# Patient Record
Sex: Female | Born: 1980 | Race: Black or African American | Hispanic: No | Marital: Married | Smoking: Never smoker
Health system: Southern US, Community
[De-identification: ages and names within clinical notes are randomized; demographics above are authoritative.]

## PROBLEM LIST (undated history)

## (undated) ENCOUNTER — Inpatient Hospital Stay (HOSPITAL_COMMUNITY): Payer: Self-pay

## (undated) DIAGNOSIS — Z789 Other specified health status: Secondary | ICD-10-CM

## (undated) HISTORY — DX: Other specified health status: Z78.9

---

## 2014-06-26 DIAGNOSIS — O321XX0 Maternal care for breech presentation, not applicable or unspecified: Secondary | ICD-10-CM

## 2016-07-06 ENCOUNTER — Encounter: Payer: Self-pay | Admitting: Obstetrics and Gynecology

## 2016-07-06 ENCOUNTER — Ambulatory Visit (INDEPENDENT_AMBULATORY_CARE_PROVIDER_SITE_OTHER): Payer: BLUE CROSS/BLUE SHIELD | Admitting: Obstetrics and Gynecology

## 2016-07-06 VITALS — BP 131/79 | HR 134 | Temp 99.5°F | Ht 62.0 in | Wt 142.9 lb

## 2016-07-06 DIAGNOSIS — O34219 Maternal care for unspecified type scar from previous cesarean delivery: Secondary | ICD-10-CM | POA: Insufficient documentation

## 2016-07-06 DIAGNOSIS — Z3490 Encounter for supervision of normal pregnancy, unspecified, unspecified trimester: Secondary | ICD-10-CM | POA: Insufficient documentation

## 2016-07-06 DIAGNOSIS — O3412 Maternal care for benign tumor of corpus uteri, second trimester: Secondary | ICD-10-CM

## 2016-07-06 DIAGNOSIS — D259 Leiomyoma of uterus, unspecified: Secondary | ICD-10-CM

## 2016-07-06 DIAGNOSIS — Z98891 History of uterine scar from previous surgery: Secondary | ICD-10-CM

## 2016-07-06 DIAGNOSIS — Z3492 Encounter for supervision of normal pregnancy, unspecified, second trimester: Secondary | ICD-10-CM | POA: Diagnosis not present

## 2016-07-06 NOTE — Addendum Note (Signed)
Addended by: Manuela Schwartz C on: 07/06/2016 03:26 PM   Modules accepted: Orders

## 2016-07-06 NOTE — Progress Notes (Signed)
Subjective:  Isabella Hampton is a 35 y.o. G2P0001 at [redacted]w[redacted]d being seen today for inital prenatal care.  She is currently monitored for the following issues for this high-risk pregnancy and has Supervision of low-risk pregnancy and H/O cesarean section on her problem list. Pt reports c section in [place] due to fibroids and breech presentation. She reports not being told she could not have a vaginal delivery  Patient reports fatigue.   .  .   . Denies leaking of fluid.   The following portions of the patient's history were reviewed and updated as appropriate: allergies, current medications, past family history, past medical history, past social history, past surgical history and problem list. Problem list updated.  Objective:   Vitals:   07/06/16 1110  BP: 131/79  Pulse: (!) 134  Temp: 99.5 F (37.5 C)  Weight: 142 lb 14.4 oz (64.8 kg)    Fetal Status:           General:  Alert, oriented and cooperative. Patient is in no acute distress.  Skin: Skin is warm and dry. No rash noted.   Cardiovascular: Normal heart rate noted  Respiratory: Normal respiratory effort, no problems with respiration noted  Abdomen: Soft, gravid, 22 weeks, consent with fibroids.     Pelvic:  Cervical exam performed        Extremities: Normal range of motion.     Mental Status: Normal mood and affect. Normal behavior. Normal judgment and thought content.   Urinalysis: Urine Protein: Negative Urine Glucose: Negative  Assessment and Plan:  Pregnancy: G2P0001 at [redacted]w[redacted]d  1. Supervision of low-risk pregnancy, second trimester  - HIV antibody - Hemoglobinopathy evaluation - Varicella zoster antibody, IgG - Culture, OB Urine - Prenatal Profile I - Pap IG, CT/NG w/ reflex HPV when ASC-U - ToxASSURE Select 13 (MW), Urine - [place] MFM OB COMP + 14 WK; Future  2. H/O cesarean section Discussed briefly with pt VBAC vs RLTCS. No records since performed in [place].   3. Prenatal care, second trimester Continue with  OTC PNV  4. Uterine fibroids affecting pregnancy, second trimester U/S ordered  Preterm labor symptoms and general obstetric precautions including but not limited to vaginal bleeding, contractions, leaking of fluid and fetal movement were reviewed in detail with the patient. Please refer to After Visit Summary for other counseling recommendations.  Return in about 4 weeks (around 08/03/2016).   Chancy Milroy, MD

## 2016-07-08 LAB — GC/CHLAMYDIA PROBE AMP
Chlamydia trachomatis, NAA: NEGATIVE
NEISSERIA GONORRHOEAE BY PCR: NEGATIVE

## 2016-07-08 LAB — URINE CULTURE, OB REFLEX

## 2016-07-08 LAB — CULTURE, OB URINE

## 2016-07-10 LAB — PAP IG, CT-NG, RFX HPV ASCU
Chlamydia, Nuc. Acid Amp: NEGATIVE
Gonococcus by Nucleic Acid Amp: NEGATIVE
PAP SMEAR COMMENT: 0

## 2016-07-12 ENCOUNTER — Telehealth: Payer: Self-pay | Admitting: *Deleted

## 2016-07-12 NOTE — Telephone Encounter (Signed)
-----   Message from Chancy Milroy, MD sent at 07/10/2016  8:13 AM EDT ----- Please notify pt of test results Thanks Legrand Como ----- Message ----- From: Lavone Neri Lab Results In Sent: 07/07/2016   6:36 AM To: Chancy Milroy, MD

## 2016-07-12 NOTE — Telephone Encounter (Signed)
Attempted to contact patient. No answer. Voice mail left stating I am calling with non urgent test results, please return my call at the clinic.

## 2016-07-12 NOTE — Telephone Encounter (Signed)
Called pt and informed her of normal test results.  Pt voiced understanding.

## 2016-07-13 ENCOUNTER — Telehealth: Payer: Self-pay | Admitting: *Deleted

## 2016-07-13 NOTE — Telephone Encounter (Signed)
Patient called to c/o cold symptoms.Call was returned with no answer.Message left for patient to call office back.

## 2016-07-14 LAB — PRENATAL PROFILE I(LABCORP)
Antibody Screen: NEGATIVE
BASOS ABS: 0 10*3/uL (ref 0.0–0.2)
Basos: 0 %
EOS (ABSOLUTE): 0 10*3/uL (ref 0.0–0.4)
Eos: 0 %
HEMOGLOBIN: 12.5 g/dL (ref 11.1–15.9)
HEP B S AG: NEGATIVE
Hematocrit: 36.6 % (ref 34.0–46.6)
IMMATURE GRANULOCYTES: 0 %
Immature Grans (Abs): 0 10*3/uL (ref 0.0–0.1)
Lymphocytes Absolute: 1.3 10*3/uL (ref 0.7–3.1)
Lymphs: 19 %
MCH: 27.7 pg (ref 26.6–33.0)
MCHC: 34.2 g/dL (ref 31.5–35.7)
MCV: 81 fL (ref 79–97)
MONOS ABS: 0.4 10*3/uL (ref 0.1–0.9)
Monocytes: 6 %
NEUTROS ABS: 5.2 10*3/uL (ref 1.4–7.0)
NEUTROS PCT: 75 %
PLATELETS: 194 10*3/uL (ref 150–379)
RBC: 4.51 x10E6/uL (ref 3.77–5.28)
RDW: 14.6 % (ref 12.3–15.4)
RPR Ser Ql: NONREACTIVE
Rh Factor: POSITIVE
Rubella Antibodies, IGG: 7.59 index (ref >0.99)
WBC: 7 10*3/uL (ref 3.4–10.8)

## 2016-07-14 LAB — HEMOGLOBINOPATHY EVALUATION
HEMOGLOBIN F QUANTITATION: 0 % (ref 0.0–2.0)
HGB C: 0 %
HGB S: 0 %
Hemoglobin A2 Quantitation: 2.3 % (ref 0.7–3.1)
Hgb A: 97.7 % (ref 94.0–98.0)

## 2016-07-14 LAB — HIV ANTIBODY (ROUTINE TESTING W REFLEX): HIV SCREEN 4TH GENERATION: NONREACTIVE

## 2016-07-14 LAB — VARICELLA ZOSTER ANTIBODY, IGG: Varicella zoster IgG: <135 index — ABNORMAL LOW (ref >165)

## 2016-07-14 LAB — TOXASSURE SELECT 13 (MW), URINE

## 2016-07-18 ENCOUNTER — Encounter (HOSPITAL_COMMUNITY): Payer: Self-pay | Admitting: Obstetrics and Gynecology

## 2016-07-27 ENCOUNTER — Ambulatory Visit (HOSPITAL_COMMUNITY)
Admission: RE | Admit: 2016-07-27 | Discharge: 2016-07-27 | Disposition: A | Discharge status: Home / Self Care | Payer: BLUE CROSS/BLUE SHIELD | Source: Ambulatory Visit | Attending: Obstetrics and Gynecology | Admitting: Obstetrics and Gynecology

## 2016-07-27 DIAGNOSIS — Z3A19 19 weeks gestation of pregnancy: Secondary | ICD-10-CM | POA: Insufficient documentation

## 2016-07-27 DIAGNOSIS — Z369 Encounter for antenatal screening, unspecified: Secondary | ICD-10-CM | POA: Diagnosis not present

## 2016-07-27 DIAGNOSIS — O34219 Maternal care for unspecified type scar from previous cesarean delivery: Secondary | ICD-10-CM | POA: Insufficient documentation

## 2016-07-27 DIAGNOSIS — Z3492 Encounter for supervision of normal pregnancy, unspecified, second trimester: Secondary | ICD-10-CM

## 2016-07-28 ENCOUNTER — Encounter: Payer: Self-pay | Admitting: Obstetrics and Gynecology

## 2016-07-28 DIAGNOSIS — D259 Leiomyoma of uterus, unspecified: Secondary | ICD-10-CM | POA: Insufficient documentation

## 2016-07-28 DIAGNOSIS — O3410 Maternal care for benign tumor of corpus uteri, unspecified trimester: Secondary | ICD-10-CM

## 2016-08-01 ENCOUNTER — Other Ambulatory Visit: Payer: Self-pay

## 2016-08-01 DIAGNOSIS — Z3492 Encounter for supervision of normal pregnancy, unspecified, second trimester: Secondary | ICD-10-CM

## 2016-08-03 ENCOUNTER — Ambulatory Visit (INDEPENDENT_AMBULATORY_CARE_PROVIDER_SITE_OTHER): Payer: BLUE CROSS/BLUE SHIELD | Admitting: Obstetrics and Gynecology

## 2016-08-03 DIAGNOSIS — O34219 Maternal care for unspecified type scar from previous cesarean delivery: Secondary | ICD-10-CM

## 2016-08-03 DIAGNOSIS — Z98891 History of uterine scar from previous surgery: Secondary | ICD-10-CM

## 2016-08-03 DIAGNOSIS — Z3492 Encounter for supervision of normal pregnancy, unspecified, second trimester: Secondary | ICD-10-CM

## 2016-08-03 NOTE — Progress Notes (Signed)
   PRENATAL VISIT NOTE  Subjective:  Isabella Hampton is a 35 y.o. G2P0001 at [redacted]w[redacted]d being seen today for ongoing prenatal care.  She is currently monitored for the following issues for this low-risk pregnancy and has Supervision of low-risk pregnancy; H/O cesarean section; and Uterine fibroids affecting pregnancy on her problem list.  Patient reports no complaints.  Contractions: Not present. Vag. Bleeding: None.  Movement: Present. Denies leaking of fluid.   The following portions of the patient's history were reviewed and updated as appropriate: allergies, current medications, past family history, past medical history, past social history, past surgical history and problem list. Problem list updated.  Objective:   Vitals:   08/03/16 1045  BP: (!) 138/93  Pulse: (!) 114  Temp: 98.3 F (36.8 C)  Weight: 145 lb 6.4 oz (66 kg)    Fetal Status:     Movement: Present     General:  Alert, oriented and cooperative. Patient is in no acute distress.  Skin: Skin is warm and dry. No rash noted.   Cardiovascular: Normal heart rate noted  Respiratory: Normal respiratory effort, no problems with respiration noted  Abdomen: Soft, gravid, appropriate for gestational age. Pain/Pressure: Absent     Pelvic:  Cervical exam deferred        Extremities: Normal range of motion.  Edema: None  Mental Status: Normal mood and affect. Normal behavior. Normal judgment and thought content.   Urinalysis: Urine Protein: Negative Urine Glucose: Negative  Assessment and Plan:  Pregnancy: G2P0001 at [redacted]w[redacted]d  1. Encounter for supervision of low-risk pregnancy in second trimester Patient is doing well without complaints Reviewed anatomy ultrasound reports. Patient is scheduled for follow up growth in 4 weeks Patient declined flu vaccine Patient declined quad screen Patient undecided on TOLAC vs RCS. Information provided Monitor BP closely  General obstetric precautions including but not limited to vaginal  bleeding, contractions, leaking of fluid and fetal movement were reviewed in detail with the patient. Please refer to After Visit Summary for other counseling recommendations.  Return in about 4 weeks (around 08/31/2016).  Mora Bellman, MD

## 2016-08-03 NOTE — Patient Instructions (Signed)
Trial of Labor After Cesarean Delivery Information A trial of labor after cesarean delivery (TOLAC) is when a woman tries to give birth vaginally after a previous cesarean delivery. TOLAC may be a safe and appropriate option for you depending on your medical history and other risk factors. When TOLAC is successful and you are able to have a vaginal delivery, this is called a vaginal birth after cesarean delivery (VBAC).  CANDIDATES FOR TOLAC TOLAC is possible for some women who:  Have undergone one or two prior cesarean deliveries in which the incision of the uterus was horizontal (low transverse).  Are carrying twins and have had one prior low transverse incision during a cesarean delivery.  Do not have a vertical (classical) uterine scar.  Have not had a tear in the wall of their uterus (uterine rupture). TOLAC is also supported for women who meet appropriate criteria and:  Are under the age of 40 years.  Are tall and have a body mass index (BMI) of less than 30.  Have an unknown uterine scar.  Give birth in a facility equipped to handle an emergency cesarean delivery. This team should be able to handle possible complications such as a uterine rupture.  Have thorough counseling about the benefits and risks of TOLAC.  Have discussed future pregnancy plans with their health care provider.  Plan to have several more pregnancies. MOST SUCCESSFUL CANDIDATES FOR TOLAC:  Have had a successful vaginal delivery before or after their cesarean delivery.  Experience labor that begins naturally on or before the due date (40 weeks of gestation).  Do not have a very large (macrosomic) baby.   Had a prior cesarean delivery but are not currently experiencing factors that would prompt a cesarean delivery (such as a breech position).  Had only one prior cesarean delivery.  Had a prior cesarean delivery that was performed early in labor and not after full cervical dilation. TOLAC may be most  appropriate for women who meet the above guidelines and who plan to have more pregnancies. TOLAC is not recommended for home births. LEAST SUCCESSFUL CANDIDATES FOR TOLAC:  Have an induced labor with an unfavorable cervix. An unfavorable cervix is when the cervix is not dilating enough (among other factors).  Have never had a vaginal delivery.  Have had more than two cesarean deliveries.  Have a pregnancy at more than 40 weeks of gestation.  Are pregnant with a baby with a suspected weight greater than 4,000 grams (8 pounds) and who have no prior history of a vaginal delivery.  Have closely spaced pregnancies. SUGGESTED BENEFITS OF TOLAC  You may have a faster recovery time.  You may have a shorter stay in the hospital.  You may have less pain and fewer problems than with a cesarean delivery. Women who have a cesarean delivery have a higher chance of needing blood or getting a fever, an infection, or a blood clot in the legs. SUGGESTED RISKS OF TOLAC The highest risk of complications happens to women who attempt a TOLAC and fail. A failed TOLAC results in an unplanned cesarean delivery. Risks related to TOLAC or repeat cesarean deliveries include:   Blood loss.  Infection.  Blood clot.  Injury to surrounding tissues or organs.  Having to remove the uterus (hysterectomy).  Potential problems with the placenta (such as placenta previa or placenta accreta) in future pregnancies. Although very rare, the main concerns with TOLAC are:  Rupture of the uterine scar from a past cesarean delivery.  Needing an   emergency cesarean delivery.  Having a bad outcome for the baby (perinatal morbidity). FOR MORE INFORMATION American Congress of Obstetricians and Gynecologists: www.acog.[place]: www.midwife.org   This information is not intended to replace advice given to you by your health care provider. Make sure you discuss any questions you have with  your health care provider.   Document Released: 06/27/2011 Document Revised: 07/30/2013 Document Reviewed: 03/31/2013 Elsevier Interactive Patient Education [employer].

## 2016-08-03 NOTE — Progress Notes (Signed)
Repeat BP 147/82

## 2016-08-03 NOTE — Progress Notes (Signed)
Patient is in office and states that she feels well but is a little anxious, reports good fetal movement.

## 2016-08-14 ENCOUNTER — Telehealth: Payer: Self-pay | Admitting: Obstetrics and Gynecology

## 2016-08-14 DIAGNOSIS — Z3492 Encounter for supervision of normal pregnancy, unspecified, second trimester: Secondary | ICD-10-CM

## 2016-08-14 MED ORDER — PRENATAL VITAMINS 0.8 MG PO TABS: 1.0000 | ORAL_TABLET | Freq: Every day | ORAL | 12 refills | Status: DC

## 2016-08-14 NOTE — Telephone Encounter (Signed)
Patient called to state that her prenatal vitamins were never called in.  Routed Rx to pharmacy per protocol.

## 2016-08-31 ENCOUNTER — Ambulatory Visit (INDEPENDENT_AMBULATORY_CARE_PROVIDER_SITE_OTHER): Payer: BLUE CROSS/BLUE SHIELD | Admitting: Obstetrics & Gynecology

## 2016-08-31 VITALS — BP 131/87 | HR 139 | Temp 97.2°F | Wt 146.4 lb

## 2016-08-31 DIAGNOSIS — O34219 Maternal care for unspecified type scar from previous cesarean delivery: Secondary | ICD-10-CM

## 2016-08-31 DIAGNOSIS — Z3492 Encounter for supervision of normal pregnancy, unspecified, second trimester: Secondary | ICD-10-CM

## 2016-08-31 MED ORDER — PRENATAL VITAMINS 0.8 MG PO TABS: 1.0000 | ORAL_TABLET | Freq: Every day | ORAL | 12 refills | Status: AC

## 2016-08-31 NOTE — Progress Notes (Signed)
   PRENATAL VISIT NOTE  Subjective:  Isabella Hampton is a 35 y.o. G2P0001 at [redacted]w[redacted]d being seen today for ongoing prenatal care.  She is currently monitored for the following issues for this low-risk pregnancy and has Supervision of low-risk pregnancy; Previous cesarean delivery, antepartum; and Uterine fibroids affecting pregnancy on her problem list.  Patient reports no complaints.  Contractions: Not present. Vag. Bleeding: None.  Movement: Present. Denies leaking of fluid.   The following portions of the patient's history were reviewed and updated as appropriate: allergies, current medications, past family history, past medical history, past social history, past surgical history and problem list. Problem list updated.  Objective:   Vitals:   08/31/16 1306  BP: 131/87  Pulse: (!) 139  Temp: 97.2 F (36.2 C)  Weight: 146 lb 6.4 oz (66.4 kg)    Fetal Status: Fetal Heart Rate (bpm): 157 Fundal Height: 24 cm Movement: Present     General:  Alert, oriented and cooperative. Patient is in no acute distress.  Skin: Skin is warm and dry. No rash noted.   Cardiovascular: Normal heart rate noted  Respiratory: Normal respiratory effort, no problems with respiration noted  Abdomen: Soft, gravid, appropriate for gestational age. Pain/Pressure: Absent     Pelvic:  Cervical exam deferred        Extremities: Normal range of motion.  Edema: None  Mental Status: Normal mood and affect. Normal behavior. Normal judgment and thought content.   Assessment and Plan:  Pregnancy: G2P0001 at [redacted]w[redacted]d 1. Previous cesarean delivery, antepartum Counseled regarding TOLAC vs RCS; risks/benefits discussed in detail. All questions answered.  Patient elects for TOLAC, consent signed 08/31/2016.   2. Encounter for supervision of low-risk pregnancy in second trimester - Prenatal Multivit-Min-Fe-FA (PRENATAL VITAMINS) 0.8 MG tablet; Take 1 tablet by mouth daily.  Dispense: 30 tablet; Refill: 12 Preterm labor symptoms  and general obstetric precautions including but not limited to vaginal bleeding, contractions, leaking of fluid and fetal movement were reviewed in detail with the patient. Please refer to After Visit Summary for other counseling recommendations.  Return in about 4 weeks (around 09/28/2016) for 2 hr GTT, OB Visit, 3rd trimester labs, TDap.   Osborne Oman, MD

## 2016-08-31 NOTE — Patient Instructions (Signed)
Return to clinic for any scheduled appointments or obstetric concerns, or go to [facility]U for evaluation  

## 2016-09-07 ENCOUNTER — Other Ambulatory Visit: Payer: Self-pay | Admitting: Obstetrics and Gynecology

## 2016-09-07 ENCOUNTER — Ambulatory Visit (HOSPITAL_COMMUNITY)
Admission: RE | Admit: 2016-09-07 | Discharge: 2016-09-07 | Disposition: A | Discharge status: Home / Self Care | Payer: BLUE CROSS/BLUE SHIELD | Source: Ambulatory Visit | Attending: Obstetrics and Gynecology | Admitting: Obstetrics and Gynecology

## 2016-09-07 ENCOUNTER — Ambulatory Visit (HOSPITAL_COMMUNITY): Payer: BLUE CROSS/BLUE SHIELD

## 2016-09-07 DIAGNOSIS — O3413 Maternal care for benign tumor of corpus uteri, third trimester: Secondary | ICD-10-CM

## 2016-09-07 DIAGNOSIS — Z0489 Encounter for examination and observation for other specified reasons: Secondary | ICD-10-CM

## 2016-09-07 DIAGNOSIS — Z3A25 25 weeks gestation of pregnancy: Secondary | ICD-10-CM | POA: Diagnosis not present

## 2016-09-07 DIAGNOSIS — O34219 Maternal care for unspecified type scar from previous cesarean delivery: Secondary | ICD-10-CM | POA: Insufficient documentation

## 2016-09-07 DIAGNOSIS — Z362 Encounter for other antenatal screening follow-up: Secondary | ICD-10-CM | POA: Insufficient documentation

## 2016-09-07 DIAGNOSIS — D259 Leiomyoma of uterus, unspecified: Secondary | ICD-10-CM | POA: Diagnosis not present

## 2016-09-07 DIAGNOSIS — IMO0002 Reserved for concepts with insufficient information to code with codable children: Secondary | ICD-10-CM

## 2016-09-07 DIAGNOSIS — O3412 Maternal care for benign tumor of corpus uteri, second trimester: Secondary | ICD-10-CM | POA: Insufficient documentation

## 2016-09-07 DIAGNOSIS — Z3492 Encounter for supervision of normal pregnancy, unspecified, second trimester: Secondary | ICD-10-CM

## 2016-09-11 ENCOUNTER — Encounter: Payer: Self-pay | Admitting: *Deleted

## 2016-09-28 ENCOUNTER — Encounter: Payer: Self-pay | Admitting: Obstetrics

## 2016-09-28 ENCOUNTER — Ambulatory Visit (INDEPENDENT_AMBULATORY_CARE_PROVIDER_SITE_OTHER): Payer: BLUE CROSS/BLUE SHIELD | Admitting: Obstetrics

## 2016-09-28 ENCOUNTER — Other Ambulatory Visit: Payer: BLUE CROSS/BLUE SHIELD

## 2016-09-28 VITALS — BP 146/84 | HR 130 | Wt 154.0 lb

## 2016-09-28 DIAGNOSIS — Z3483 Encounter for supervision of other normal pregnancy, third trimester: Secondary | ICD-10-CM

## 2016-09-28 DIAGNOSIS — Z3493 Encounter for supervision of normal pregnancy, unspecified, third trimester: Secondary | ICD-10-CM

## 2016-09-28 NOTE — Progress Notes (Signed)
Subjective:    Isabella Hampton is a 35 y.o. female being seen today for her obstetrical visit. She is at [redacted]w[redacted]d gestation. Patient reports no complaints. Fetal movement: normal.  Problem List Items Addressed This Visit    None     Patient Active Problem List   Diagnosis Date Noted  . Uterine fibroids affecting pregnancy 07/28/2016  . Supervision of low-risk pregnancy 07/06/2016  . Previous cesarean delivery, antepartum 07/06/2016   Objective:    BP (!) 146/84   Pulse (!) 130   Wt 154 lb (69.9 kg)   LMP 03/16/2016 (Approximate)   BMI 28.17 kg/m  FHT:  150 BPM  Uterine Size: size equals dates  Presentation: unsure     Assessment:    Pregnancy @ [redacted]w[redacted]d weeks   Plan:     labs reviewed, problem list updated Consent signed. GBS sent TDAP offered  Rhogam given for RH negative Pediatrician: discussed. Infant feeding: plans to breastfeed. Maternity leave: discussed. Cigarette smoking: never smoked. No orders of the defined types were placed in this encounter.  No orders of the defined types were placed in this encounter.  Follow up in 2 Weeks.   Patient ID: Isabella Hampton, female   DOB: 28-Apr-1981, 35 y.o.   MRN: [id number]

## 2016-09-29 LAB — CBC
Hematocrit: 34.6 % (ref 34.0–46.6)
Hemoglobin: 11.8 g/dL (ref 11.1–15.9)
MCH: 28.9 pg (ref 26.6–33.0)
MCHC: 34.1 g/dL (ref 31.5–35.7)
MCV: 85 fL (ref 79–97)
Platelets: 186 10*3/uL (ref 150–379)
RBC: 4.08 x10E6/uL (ref 3.77–5.28)
RDW: 13.7 % (ref 12.3–15.4)
WBC: 7.5 10*3/uL (ref 3.4–10.8)

## 2016-09-29 LAB — HIV ANTIBODY (ROUTINE TESTING W REFLEX): HIV SCREEN 4TH GENERATION: NONREACTIVE

## 2016-09-29 LAB — GLUCOSE TOLERANCE, 2 HOURS W/ 1HR
GLUCOSE, 1 HOUR: 118 mg/dL (ref 65–179)
GLUCOSE, 2 HOUR: 103 mg/dL (ref 65–152)
GLUCOSE, FASTING: 69 mg/dL (ref 65–91)

## 2016-09-29 LAB — RPR: RPR: NONREACTIVE

## 2016-10-17 ENCOUNTER — Encounter: Payer: BLUE CROSS/BLUE SHIELD | Admitting: Obstetrics

## 2016-10-23 NOTE — L&D Delivery Note (Signed)
Delivery Note At 2:56 PM a viable and healthy female was delivered via VBAC, Spontaneous (Presentation: OA).  APGAR: 8, 9; weight  Pending.   Placenta status: spontaneous, intact. Cord:  with the following complications: None.  Cord pH: Na  Anesthesia: Epidural, Local Episiotomy: None Lacerations: 2nd degree;Perineal Suture Repair: 3.0 vicryl rapide Est. Blood Loss (mL): 200  Mom to postpartum.  Baby to Couplet care / Skin to Skin. Placenta to: BS Feeding: Breast Circ: NA Contraception: LAM  Manya Silvas 12/23/2016, 4:02 PM

## 2016-11-13 ENCOUNTER — Encounter: Payer: Self-pay | Admitting: *Deleted

## 2016-11-13 ENCOUNTER — Ambulatory Visit (INDEPENDENT_AMBULATORY_CARE_PROVIDER_SITE_OTHER): Payer: BLUE CROSS/BLUE SHIELD | Admitting: Obstetrics

## 2016-11-13 VITALS — BP 147/82 | HR 122 | Wt 159.0 lb

## 2016-11-13 DIAGNOSIS — Z3480 Encounter for supervision of other normal pregnancy, unspecified trimester: Secondary | ICD-10-CM

## 2016-11-13 DIAGNOSIS — Z3490 Encounter for supervision of normal pregnancy, unspecified, unspecified trimester: Secondary | ICD-10-CM

## 2016-11-13 DIAGNOSIS — Z3483 Encounter for supervision of other normal pregnancy, third trimester: Secondary | ICD-10-CM

## 2016-11-13 NOTE — Progress Notes (Signed)
Subjective:    Isabella Hampton is a 36 y.o. female being seen today for her obstetrical visit. She is at [redacted]w[redacted]d gestation. Patient reports no complaints. Fetal movement: normal.  Problem List Items Addressed This Visit    Supervision of low-risk pregnancy     Patient Active Problem List   Diagnosis Date Noted  . Uterine fibroids affecting pregnancy 07/28/2016  . Supervision of low-risk pregnancy 07/06/2016  . Previous cesarean delivery, antepartum 07/06/2016   Objective:    BP (!) 147/82   Pulse (!) 122   Wt 159 lb (72.1 kg)   LMP 03/16/2016 (Approximate)   BMI 29.08 kg/m  FHT:  150 BPM  Uterine Size: size equals dates  Presentation: unsure     Assessment:    Pregnancy @ [redacted]w[redacted]d weeks   Plan:     labs reviewed, problem list updated Consent signed. GBS sent TDAP offered  Rhogam given for RH negative Pediatrician: discussed. Infant feeding: plans to breastfeed. Maternity leave: discussed. Cigarette smoking: never smoked. No orders of the defined types were placed in this encounter.  No orders of the defined types were placed in this encounter.  Follow up in 2 Weeks.   Patient ID: Isabella Hampton, female   DOB: 03-25-1981, 36 y.o.   MRN: [id number]

## 2016-11-13 NOTE — Progress Notes (Signed)
Patient reports no problems today.  

## 2016-11-28 ENCOUNTER — Ambulatory Visit (INDEPENDENT_AMBULATORY_CARE_PROVIDER_SITE_OTHER): Payer: BLUE CROSS/BLUE SHIELD | Admitting: Certified Nurse Midwife

## 2016-11-28 VITALS — BP 133/79 | HR 116 | Wt 162.0 lb

## 2016-11-28 DIAGNOSIS — Z113 Encounter for screening for infections with a predominantly sexual mode of transmission: Secondary | ICD-10-CM

## 2016-11-28 DIAGNOSIS — Z3493 Encounter for supervision of normal pregnancy, unspecified, third trimester: Secondary | ICD-10-CM | POA: Diagnosis not present

## 2016-11-28 DIAGNOSIS — O34219 Maternal care for unspecified type scar from previous cesarean delivery: Secondary | ICD-10-CM

## 2016-11-28 NOTE — Patient Instructions (Signed)
AREA PEDIATRIC/FAMILY PRACTICE PHYSICIANS  [facility] [address], [address] [place], [place]  [address] Phone - [phone]   Fax - [phone]  [facility] [address] [place], [place] [address] Phone - [phone]   Fax - [phone]  [facility] [address] [place], [place]  [address] Phone - [phone]   Fax - [phone]  [facility] [address] [place], [place]  [address] Phone - [phone]   Fax - [phone]  [facility] [address] [place], [place]  [address] Phone - [phone]   Fax - [phone]  [facility] [address] [place], [place]  [address] Phone - [phone]   Fax - [phone]  [facility] [address] [place], [place]  [address] Phone - [phone]   Fax - [phone]  [facility] [address] [place], [place]  [address] Phone - [phone]   Fax - [phone]  [facility] [address] [place], [place]  [address] Phone - [phone]   Fax - [phone] [facility] [address] [place], [place]  [address] Phone - [phone]   Fax - [phone]  [facility] [address] [place], [place]  [address] Phone - [phone]   Fax - [phone]  [facility] [address] [place], [place]  [address] Phone - [phone]   Fax - [phone]  [facility] [address] [place], [place]  [address] Phone - [phone]   Fax - [phone]  [facility] [address] [place], [place]  [address] Phone - [phone]  [facility] [address] [place], [place]  [address] Phone - [phone]   Fax - [phone]  [facility] [address] [place], [place]  [address] Phone - [phone]   Fax - [phone]  [facility] [address] [place], [place]  [address] Phone - [phone]   Fax - [phone]  [facility] [address] [place], [place]  [address] Phone - [phone]   Fax - [phone]   [facility] [address] [place], [place]  [address] Phone - [phone]   Fax - [phone]  [facility] [address] [place], [place]  [address] Phone - [phone]   Fax - [phone]  [facility] [address] [place], [place]  [address] Phone - [phone]   Fax - [phone]  [facility] [address] , [place]  [address] Phone - [phone]   Fax - [phone]  [facility] [address] , [place]  [address] Phone - [phone]   Fax - [phone] [facility] [address] [place], [place]  [address] Phone - [phone]   Fax - [phone]  [facility] [address] [place], [place]  [address] Phone - [phone]   Fax - [phone]  [facility] [address] [place],   [address] Phone - [phone]   Fax - [phone]   

## 2016-11-28 NOTE — Progress Notes (Signed)
   PRENATAL VISIT NOTE  Subjective:  Isabella Hampton is a 36 y.o. G2P0001 at [redacted]w[redacted]d being seen today for ongoing prenatal care.  She is currently monitored for the following issues for this low-risk pregnancy and has Supervision of low-risk pregnancy; Previous cesarean delivery, antepartum; and Uterine fibroids affecting pregnancy on her problem list.  Patient reports no complaints.  Contractions: Irregular. Vag. Bleeding: None.  Movement: Present. Denies leaking of fluid.   The following portions of the patient's history were reviewed and updated as appropriate: allergies, current medications, past family history, past medical history, past social history, past surgical history and problem list. Problem list updated.  Objective:   Vitals:   11/28/16 1422  BP: 133/79  Pulse: (!) 116  Weight: 162 lb (73.5 kg)    Fetal Status: Fetal Heart Rate (bpm): 146 Fundal Height: 37 cm Movement: Present  Presentation: Vertex  General:  Alert, oriented and cooperative. Patient is in no acute distress.  Skin: Skin is warm and dry. No rash noted.   Cardiovascular: Normal heart rate noted  Respiratory: Normal respiratory effort, no problems with respiration noted  Abdomen: Soft, gravid, appropriate for gestational age. Pain/Pressure: Present     Pelvic:  Cervical exam performed Dilation: Closed Effacement (%): 0 Station: -3  Extremities: Normal range of motion.     Mental Status: Normal mood and affect. Normal behavior. Normal judgment and thought content.   Assessment and Plan:  Pregnancy: G2P0001 at [redacted]w[redacted]d  1. Encounter for supervision of low-risk pregnancy in third trimester  - Strep Gp B NAA - Cervicovaginal ancillary only  2. Previous cesarean delivery, antepartum     TOLAC consent on file: 08-31-16.    Term labor symptoms and general obstetric precautions including but not limited to vaginal bleeding, contractions, leaking of fluid and fetal movement were reviewed in detail with the  patient. Please refer to After Visit Summary for other counseling recommendations.  Return in about 1 week (around 12/05/2016) for ROB.   Morene Crocker, CNM

## 2016-11-29 LAB — CERVICOVAGINAL ANCILLARY ONLY
Bacterial vaginitis: NEGATIVE
CHLAMYDIA, DNA PROBE: NEGATIVE
Candida vaginitis: NEGATIVE
Neisseria Gonorrhea: NEGATIVE
TRICH (WINDOWPATH): NEGATIVE

## 2016-11-30 LAB — STREP GP B NAA: Strep Gp B NAA: NEGATIVE

## 2016-12-05 ENCOUNTER — Inpatient Hospital Stay (HOSPITAL_COMMUNITY)
Admission: AD | Admit: 2016-12-05 | Discharge: 2016-12-06 | Disposition: A | Discharge status: Home / Self Care | Payer: BLUE CROSS/BLUE SHIELD | Source: Ambulatory Visit | Attending: Obstetrics & Gynecology | Admitting: Obstetrics & Gynecology

## 2016-12-05 ENCOUNTER — Encounter (HOSPITAL_COMMUNITY): Payer: Self-pay

## 2016-12-05 DIAGNOSIS — O26899 Other specified pregnancy related conditions, unspecified trimester: Secondary | ICD-10-CM | POA: Insufficient documentation

## 2016-12-05 DIAGNOSIS — R109 Unspecified abdominal pain: Secondary | ICD-10-CM | POA: Insufficient documentation

## 2016-12-05 DIAGNOSIS — Z3A00 Weeks of gestation of pregnancy not specified: Secondary | ICD-10-CM | POA: Insufficient documentation

## 2016-12-05 NOTE — MAU Note (Signed)
Patient presents with c/o abdominal pain. Patient states that it was a sharp constant pain that happened this morning for 2 hrs. Patient is not having that pain now but is having some slight cramping. Patient denies and bleeding or LOF. Fetus active.

## 2016-12-06 DIAGNOSIS — O26899 Other specified pregnancy related conditions, unspecified trimester: Secondary | ICD-10-CM | POA: Diagnosis not present

## 2016-12-06 DIAGNOSIS — Z3A00 Weeks of gestation of pregnancy not specified: Secondary | ICD-10-CM | POA: Diagnosis not present

## 2016-12-06 DIAGNOSIS — R109 Unspecified abdominal pain: Secondary | ICD-10-CM | POA: Diagnosis present

## 2016-12-06 NOTE — Progress Notes (Signed)
Notified of pt arrival in MAU with complaint and exam. Ok to discharge home with labor precautions.

## 2016-12-06 NOTE — Discharge Instructions (Signed)
Third Trimester of Pregnancy The third trimester is from week 29 through week 40 (months 7 through 9). The third trimester is a time when the unborn baby (fetus) is growing rapidly. At the end of the ninth month, the fetus is about 20 inches in length and weighs 6-10 pounds. Body changes during your third trimester Your body goes through many changes during pregnancy. The changes vary from woman to woman. During the third trimester:  Your weight will continue to increase. You can expect to gain 25-35 pounds (11-16 kg) by the end of the pregnancy.  You may begin to get stretch marks on your hips, abdomen, and breasts.  You may urinate more often because the fetus is moving lower into your pelvis and pressing on your bladder.  You may develop or continue to have heartburn. This is caused by increased hormones that slow down muscles in the digestive tract.  You may develop or continue to have constipation because increased hormones slow digestion and cause the muscles that push waste through your intestines to relax.  You may develop hemorrhoids. These are swollen veins (varicose veins) in the rectum that can itch or be painful.  You may develop swollen, bulging veins (varicose veins) in your legs.  You may have increased body aches in the pelvis, back, or thighs. This is due to weight gain and increased hormones that are relaxing your joints.  You may have changes in your hair. These can include thickening of your hair, rapid growth, and changes in texture. Some women also have hair loss during or after pregnancy, or hair that feels dry or thin. Your hair will most likely return to normal after your baby is born.  Your breasts will continue to grow and they will continue to become tender. A yellow fluid (colostrum) may leak from your breasts. This is the first milk you are producing for your baby.  Your belly button may stick out.  You may notice more swelling in your hands, face, or  ankles.  You may have increased tingling or numbness in your hands, arms, and legs. The skin on your belly may also feel numb.  You may feel short of breath because of your expanding uterus.  You may have more problems sleeping. This can be caused by the size of your belly, increased need to urinate, and an increase in your body's metabolism.  You may notice the fetus "dropping," or moving lower in your abdomen.  You may have increased vaginal discharge.  Your cervix becomes thin and soft (effaced) near your due date. What to expect at prenatal visits You will have prenatal exams every 2 weeks until week 36. Then you will have weekly prenatal exams. During a routine prenatal visit:  You will be weighed to make sure you and the fetus are growing normally.  Your blood pressure will be taken.  Your abdomen will be measured to track your baby's growth.  The fetal heartbeat will be listened to.  Any test results from the previous visit will be discussed.  You may have a cervical check near your due date to see if you have effaced. At around 36 weeks, your health care provider will check your cervix. At the same time, your health care provider will also perform a test on the secretions of the vaginal tissue. This test is to determine if a type of bacteria, Group B streptococcus, is present. Your health care provider will explain this further. Your health care provider may ask you:  What your birth plan is.  How you are feeling.  If you are feeling the baby move.  If you have had any abnormal symptoms, such as leaking fluid, bleeding, severe headaches, or abdominal cramping.  If you are using any tobacco products, including cigarettes, chewing tobacco, and electronic cigarettes.  If you have any questions. Other tests or screenings that may be performed during your third trimester include:  Blood tests that check for low iron levels (anemia).  Fetal testing to check the health,  activity level, and growth of the fetus. Testing is done if you have certain medical conditions or if there are problems during the pregnancy.  Nonstress test (NST). This test checks the health of your baby to make sure there are no signs of problems, such as the baby not getting enough oxygen. During this test, a belt is placed around your belly. The baby is made to move, and its heart rate is monitored during movement. What is false labor? False labor is a condition in which you feel small, irregular tightenings of the muscles in the womb (contractions) that eventually go away. These are called Braxton Hicks contractions. Contractions may last for hours, days, or even weeks before true labor sets in. If contractions come at regular intervals, become more frequent, increase in intensity, or become painful, you should see your health care provider. What are the signs of labor?  Abdominal cramps.  Regular contractions that start at 10 minutes apart and become stronger and more frequent with time.  Contractions that start on the top of the uterus and spread down to the lower abdomen and back.  Increased pelvic pressure and dull back pain.  A watery or bloody mucus discharge that comes from the vagina.  Leaking of amniotic fluid. This is also known as your "water breaking." It could be a slow trickle or a gush. Let your doctor know if it has a color or strange odor. If you have any of these signs, call your health care provider right away, even if it is before your due date. Follow these instructions at home: Eating and drinking  Continue to eat regular, healthy meals.  Do not eat:  Raw meat or meat spreads.  Unpasteurized milk or cheese.  Unpasteurized juice.  Store-made salad.  Refrigerated smoked seafood.  Hot dogs or deli meat, unless they are piping hot.  More than 6 ounces of albacore tuna a week.  Shark, swordfish, king mackerel, or tile fish.  Store-made salads.  Raw  sprouts, such as mung bean or alfalfa sprouts.  Take prenatal vitamins as told by your health care provider.  Take 1000 mg of calcium daily as told by your health care provider.  If you develop constipation:  Take over-the-counter or prescription medicines.  Drink enough fluid to keep your urine clear or pale yellow.  Eat foods that are high in fiber, such as fresh fruits and vegetables, whole grains, and beans.  Limit foods that are high in fat and processed sugars, such as fried and sweet foods. Activity  Exercise only as directed by your health care provider. Healthy pregnant women should aim for 2 hours and 30 minutes of moderate exercise per week. If you experience any pain or discomfort while exercising, stop.  Avoid heavy lifting.  Do not exercise in extreme heat or humidity, or at high altitudes.  Wear low-heel, comfortable shoes.  Practice good posture.  Do not travel far distances unless it is absolutely necessary and only with the approval  of your health care provider.  Wear your seat belt at all times while in a car, on a bus, or on a plane.  Take frequent breaks and rest with your legs elevated if you have leg cramps or low back pain.  Do not use hot tubs, steam rooms, or saunas.  You may continue to have sex unless your health care provider tells you otherwise. Lifestyle  Do not use any products that contain nicotine or tobacco, such as cigarettes and e-cigarettes. If you need help quitting, ask your health care provider.  Do not drink alcohol.  Do not use any medicinal herbs or unprescribed drugs. These chemicals affect the formation and growth of the baby.  If you develop varicose veins:  Wear support pantyhose or compression stockings as told by your healthcare provider.  Elevate your feet for 15 minutes, 3-4 times a day.  Wear a supportive maternity bra to help with breast tenderness. General instructions  Take over-the-counter and prescription  medicines only as told by your health care provider. There are medicines that are either safe or unsafe to take during pregnancy.  Take warm sitz baths to soothe any pain or discomfort caused by hemorrhoids. Use hemorrhoid cream or witch hazel if your health care provider approves.  Avoid cat litter boxes and soil used by cats. These carry germs that can cause birth defects in the baby. If you have a cat, ask someone to clean the litter box for you.  To prepare for the arrival of your baby:  Take prenatal classes to understand, practice, and ask questions about the labor and delivery.  Make a trial run to the hospital.  Visit the hospital and tour the maternity area.  Arrange for maternity or paternity leave through employers.  Arrange for family and friends to take care of pets while you are in the hospital.  Purchase a rear-facing car seat and make sure you know how to install it in your car.  Pack your hospital bag.  Prepare the babys nursery. Make sure to remove all pillows and stuffed animals from the baby's crib to prevent suffocation.  Visit your dentist if you have not gone during your pregnancy. Use a soft toothbrush to brush your teeth and be gentle when you floss.  Keep all prenatal follow-up visits as told by your health care provider. This is important. Contact a health care provider if:  You are unsure if you are in labor or if your water has broken.  You become dizzy.  You have mild pelvic cramps, pelvic pressure, or nagging pain in your abdominal area.  You have lower back pain.  You have persistent nausea, vomiting, or diarrhea.  You have an unusual or bad smelling vaginal discharge.  You have pain when you urinate. Get help right away if:  You have a fever.  You are leaking fluid from your vagina.  You have spotting or bleeding from your vagina.  You have severe abdominal pain or cramping.  You have rapid weight loss or weight gain.  You have  shortness of breath with chest pain.  You notice sudden or extreme swelling of your face, hands, ankles, feet, or legs.  Your baby makes fewer than 10 movements in 2 hours.  You have severe headaches that do not go away with medicine.  You have vision changes. Summary  The third trimester is from week 29 through week 40, months 7 through 9. The third trimester is a time when the unborn baby (fetus)  is growing rapidly.  During the third trimester, your discomfort may increase as you and your baby continue to gain weight. You may have abdominal, leg, and back pain, sleeping problems, and an increased need to urinate.  During the third trimester your breasts will keep growing and they will continue to become tender. A yellow fluid (colostrum) may leak from your breasts. This is the first milk you are producing for your baby.  False labor is a condition in which you feel small, irregular tightenings of the muscles in the womb (contractions) that eventually go away. These are called Braxton Hicks contractions. Contractions may last for hours, days, or even weeks before true labor sets in.  Signs of labor can include: abdominal cramps; regular contractions that start at 10 minutes apart and become stronger and more frequent with time; watery or bloody mucus discharge that comes from the vagina; increased pelvic pressure and dull back pain; and leaking of amniotic fluid. This information is not intended to replace advice given to you by your health care provider. Make sure you discuss any questions you have with your health care provider. Document Released: 10/03/2001 Document Revised: 03/16/2016 Document Reviewed: 12/10/2012 Elsevier Interactive Patient Education  2017 [employer]. Introduction Patient Name: ________________________________________________ Patient Due Date: ____________________ What is a fetal movement count? A fetal movement count is the number of times that you feel your baby  move during a certain amount of time. This may also be called a fetal kick count. A fetal movement count is recommended for every pregnant woman. You may be asked to start counting fetal movements as early as week 28 of your pregnancy. Pay attention to when your baby is most active. You may notice your baby's sleep and wake cycles. You may also notice things that make your baby move more. You should do a fetal movement count:  When your baby is normally most active.  At the same time each day. A good time to count movements is while you are resting, after having something to eat and drink. How do I count fetal movements? 1. Find a quiet, comfortable area. Sit, or lie down on your side. 2. Write down the date, the start time and stop time, and the number of movements that you felt between those two times. Take this information with you to your health care visits. 3. For 2 hours, count kicks, flutters, swishes, rolls, and jabs. You should feel at least 10 movements during 2 hours. 4. You may stop counting after you have felt 10 movements. 5. If you do not feel 10 movements in 2 hours, have something to eat and drink. Then, keep resting and counting for 1 hour. If you feel at least 4 movements during that hour, you may stop counting. Contact a health care provider if:  You feel fewer than 4 movements in 2 hours.  Your baby is not moving like he or she usually does. Date: ____________ Start time: ____________ Stop time: ____________ Movements: ____________ Date: ____________ Start time: ____________ Stop time: ____________ Movements: ____________ Date: ____________ Start time: ____________ Stop time: ____________ Movements: ____________ Date: ____________ Start time: ____________ Stop time: ____________ Movements: ____________ Date: ____________ Start time: ____________ Stop time: ____________ Movements: ____________ Date: ____________ Start time: ____________ Stop time: ____________ Movements:  ____________ Date: ____________ Start time: ____________ Stop time: ____________ Movements: ____________ Date: ____________ Start time: ____________ Stop time: ____________ Movements: ____________ Date: ____________ Start time: ____________ Stop time: ____________ Movements: ____________ This information is not intended to replace  advice given to you by your health care provider. Make sure you discuss any questions you have with your health care provider. Document Released: 11/08/2006 Document Revised: 06/07/2016 Document Reviewed: 11/18/2015 Elsevier Interactive Patient Education  2017 [employer] of the uterus can occur throughout pregnancy. Contractions are not always a sign that you are in labor.  WHAT ARE BRAXTON HICKS CONTRACTIONS?  Contractions that occur before labor are called Braxton Hicks contractions, or false labor. Toward the end of pregnancy (32-34 weeks), these contractions can develop more often and may become more forceful. This is not true labor because these contractions do not result in opening (dilatation) and thinning of the cervix. They are sometimes difficult to tell apart from true labor because these contractions can be forceful and people have different pain tolerances. You should not feel embarrassed if you go to the hospital with false labor. Sometimes, the only way to tell if you are in true labor is for your health care provider to look for changes in the cervix. If there are no prenatal problems or other health problems associated with the pregnancy, it is completely safe to be sent home with false labor and await the onset of true labor. HOW CAN YOU TELL THE DIFFERENCE BETWEEN TRUE AND FALSE LABOR? False Labor   The contractions of false labor are usually shorter and not as hard as those of true labor.   The contractions are usually irregular.   The contractions are often felt in the front of the lower abdomen and in  the groin.   The contractions may go away when you walk around or change positions while lying down.   The contractions get weaker and are shorter lasting as time goes on.   The contractions do not usually become progressively stronger, regular, and closer together as with true labor.  True Labor   Contractions in true labor last 30-70 seconds, become very regular, usually become more intense, and increase in frequency.   The contractions do not go away with walking.   The discomfort is usually felt in the top of the uterus and spreads to the lower abdomen and low back.   True labor can be determined by your health care provider with an exam. This will show that the cervix is dilating and getting thinner.  WHAT TO REMEMBER  Keep up with your usual exercises and follow other instructions given by your health care provider.   Take medicines as directed by your health care provider.   Keep your regular prenatal appointments.   Eat and drink lightly if you think you are going into labor.   If Braxton Hicks contractions are making you uncomfortable:   Change your position from lying down or resting to walking, or from walking to resting.   Sit and rest in a tub of warm water.   Drink 2-3 glasses of water. Dehydration may cause these contractions.   Do slow and deep breathing several times an hour.  WHEN SHOULD I SEEK IMMEDIATE MEDICAL CARE? Seek immediate medical care if:  Your contractions become stronger, more regular, and closer together.   You have fluid leaking or gushing from your vagina.   You have a fever.   You pass blood-tinged mucus.   You have vaginal bleeding.   You have continuous abdominal pain.   You have low back pain that you never had before.   You feel your baby's head pushing down and causing pelvic pressure.   Your  baby is not moving as much as it used to.  This information is not intended to replace advice given to you  by your health care provider. Make sure you discuss any questions you have with your health care provider. Document Released: 10/09/2005 Document Revised: 01/31/2016 Document Reviewed: 07/21/2013 Elsevier Interactive Patient Education  2017 [employer].

## 2016-12-12 ENCOUNTER — Ambulatory Visit (INDEPENDENT_AMBULATORY_CARE_PROVIDER_SITE_OTHER): Payer: BLUE CROSS/BLUE SHIELD | Admitting: Certified Nurse Midwife

## 2016-12-12 VITALS — BP 133/79 | HR 114 | Wt 164.0 lb

## 2016-12-12 DIAGNOSIS — O34219 Maternal care for unspecified type scar from previous cesarean delivery: Secondary | ICD-10-CM

## 2016-12-12 DIAGNOSIS — Z3493 Encounter for supervision of normal pregnancy, unspecified, third trimester: Secondary | ICD-10-CM

## 2016-12-12 NOTE — Progress Notes (Signed)
Pt would like cervical exam.

## 2016-12-12 NOTE — Progress Notes (Signed)
   PRENATAL VISIT NOTE  Subjective:  Isabella Hampton is a 36 y.o. G2P0001 at [redacted]w[redacted]d being seen today for ongoing prenatal care.  She is currently monitored for the following issues for this low-risk pregnancy and has Supervision of low-risk pregnancy; Previous cesarean delivery, antepartum; and Uterine fibroids affecting pregnancy on her problem list.  Patient reports no complaints.  Contractions: Irregular. Vag. Bleeding: None.  Movement: Present. Denies leaking of fluid.   The following portions of the patient's history were reviewed and updated as appropriate: allergies, current medications, past family history, past medical history, past social history, past surgical history and problem list. Problem list updated.  Objective:   Vitals:   12/12/16 1443  BP: 133/79  Pulse: (!) 114  Weight: 164 lb (74.4 kg)    Fetal Status: Fetal Heart Rate (bpm): 148 Fundal Height: 39 cm Movement: Present  Presentation: Vertex  General:  Alert, oriented and cooperative. Patient is in no acute distress.  Skin: Skin is warm and dry. No rash noted.   Cardiovascular: Normal heart rate noted  Respiratory: Normal respiratory effort, no problems with respiration noted  Abdomen: Soft, gravid, appropriate for gestational age. Pain/Pressure: Present     Pelvic:  Cervical exam performed Dilation: Closed Effacement (%): 0 Station: -3  Extremities: Normal range of motion.     Mental Status: Normal mood and affect. Normal behavior. Normal judgment and thought content.   Assessment and Plan:  Pregnancy: G2P0001 at [redacted]w[redacted]d  1. Previous cesarean delivery, antepartum     TOLAC consent signed with Dr. Harolyn Rutherford on 08/31/16. In media file.    2. Encounter for supervision of low-risk pregnancy in third trimester     Doing well.  Maybe moving after delivery to [place] area.   Term labor symptoms and general obstetric precautions including but not limited to vaginal bleeding, contractions, leaking of fluid and fetal  movement were reviewed in detail with the patient. Please refer to After Visit Summary for other counseling recommendations.  Return in about 1 week (around 12/19/2016) for ROB, NST.   Morene Crocker, CNM

## 2016-12-19 ENCOUNTER — Ambulatory Visit (INDEPENDENT_AMBULATORY_CARE_PROVIDER_SITE_OTHER): Payer: BLUE CROSS/BLUE SHIELD | Admitting: Certified Nurse Midwife

## 2016-12-19 VITALS — BP 128/79 | HR 116 | Wt 164.0 lb

## 2016-12-19 DIAGNOSIS — Z3493 Encounter for supervision of normal pregnancy, unspecified, third trimester: Secondary | ICD-10-CM

## 2016-12-19 DIAGNOSIS — O34219 Maternal care for unspecified type scar from previous cesarean delivery: Secondary | ICD-10-CM

## 2016-12-19 NOTE — Progress Notes (Addendum)
   PRENATAL VISIT NOTE  Subjective:  Isabella Hampton is a 36 y.o. G2P0001 at [redacted]w[redacted]d being seen today for ongoing prenatal care.  She is currently monitored for the following issues for this low-risk pregnancy and has Supervision of low-risk pregnancy; Previous cesarean delivery, antepartum; and Uterine fibroids affecting pregnancy on her problem list.  Patient reports no complaints.  Contractions: Irregular. Vag. Bleeding: None.  Movement: Present. Denies leaking of fluid.   The following portions of the patient's history were reviewed and updated as appropriate: allergies, current medications, past family history, past medical history, past social history, past surgical history and problem list. Problem list updated.  Objective:   Vitals:   12/19/16 1443  BP: 128/79  Pulse: (!) 116  Weight: 164 lb (74.4 kg)    Fetal Status: Fetal Heart Rate (bpm): NST; reactive Fundal Height: 40 cm Movement: Present  Presentation: Vertex  General:  Alert, oriented and cooperative. Patient is in no acute distress.  Skin: Skin is warm and dry. No rash noted.   Cardiovascular: Normal heart rate noted  Respiratory: Normal respiratory effort, no problems with respiration noted  Abdomen: Soft, gravid, appropriate for gestational age. Pain/Pressure: Present     Pelvic:  Cervical exam performed Dilation: Closed Effacement (%): 0 Station: -3  Extremities: Normal range of motion.     Mental Status: Normal mood and affect. Normal behavior. Normal judgment and thought content.   Assessment and Plan:  Pregnancy: G2P0001 at [redacted]w[redacted]d  1. Encounter for supervision of low-risk pregnancy in third trimester     Doing well.      Reactive NST.   NST: + accels, no decels, moderate variability, Cat. 1 tracing. No contractions on toco.   2. Previous cesarean delivery, antepartum     TOLAC consent done 08/31/17  Term labor symptoms and general obstetric precautions including but not limited to vaginal bleeding,  contractions, leaking of fluid and fetal movement were reviewed in detail with the patient. Please refer to After Visit Summary for other counseling recommendations.  Return in about 4 weeks (around 01/16/2017) for Postpartum.  IOL scheduled for midnight on Thursday night-Friday morning. No Cytotec previous C-section.    Morene Crocker, CNM

## 2016-12-20 ENCOUNTER — Telehealth (HOSPITAL_COMMUNITY): Payer: Self-pay | Admitting: *Deleted

## 2016-12-20 ENCOUNTER — Encounter (HOSPITAL_COMMUNITY): Payer: Self-pay | Admitting: *Deleted

## 2016-12-20 NOTE — Telephone Encounter (Signed)
Preadmission screen  

## 2016-12-22 ENCOUNTER — Inpatient Hospital Stay (HOSPITAL_COMMUNITY)
Admission: RE | Admit: 2016-12-22 | Discharge: 2016-12-25 | DRG: 775 | Disposition: A | Discharge status: Home / Self Care | Payer: BLUE CROSS/BLUE SHIELD | Source: Ambulatory Visit | Attending: Obstetrics and Gynecology | Admitting: Obstetrics and Gynecology

## 2016-12-22 ENCOUNTER — Encounter (HOSPITAL_COMMUNITY): Payer: Self-pay

## 2016-12-22 VITALS — BP 100/62 | HR 89 | Temp 98.0°F | Resp 18 | Ht 64.0 in | Wt 156.0 lb

## 2016-12-22 DIAGNOSIS — O34219 Maternal care for unspecified type scar from previous cesarean delivery: Secondary | ICD-10-CM

## 2016-12-22 DIAGNOSIS — Z3A41 41 weeks gestation of pregnancy: Secondary | ICD-10-CM

## 2016-12-22 DIAGNOSIS — O411230 Chorioamnionitis, third trimester, not applicable or unspecified: Secondary | ICD-10-CM | POA: Diagnosis present

## 2016-12-22 DIAGNOSIS — O34211 Maternal care for low transverse scar from previous cesarean delivery: Secondary | ICD-10-CM | POA: Diagnosis present

## 2016-12-22 DIAGNOSIS — O480 Post-term pregnancy: Secondary | ICD-10-CM | POA: Diagnosis present

## 2016-12-22 LAB — TYPE AND SCREEN
ABO/RH(D): O POS
ANTIBODY SCREEN: NEGATIVE

## 2016-12-22 LAB — CBC
HEMATOCRIT: 33.8 % — AB (ref 36.0–46.0)
Hemoglobin: 11.7 g/dL — ABNORMAL LOW (ref 12.0–15.0)
MCH: 27.5 pg (ref 26.0–34.0)
MCHC: 34.6 g/dL (ref 30.0–36.0)
MCV: 79.3 fL (ref 78.0–100.0)
PLATELETS: 182 10*3/uL (ref 150–400)
RBC: 4.26 MIL/uL (ref 3.87–5.11)
RDW: 13.7 % (ref 11.5–15.5)
WBC: 8.5 10*3/uL (ref 4.0–10.5)

## 2016-12-22 LAB — RPR: RPR Ser Ql: NONREACTIVE

## 2016-12-22 LAB — ABO/RH: ABO/RH(D): O POS

## 2016-12-22 MED ORDER — FENTANYL 2.5 MCG/ML BUPIVACAINE 1/10 % EPIDURAL INFUSION (WH - ANES)
14.0000 mL/h | INTRAMUSCULAR | Status: DC | PRN
  Administered 2016-12-23 (×3): 14 mL/h via EPIDURAL
  Filled 2016-12-22 (×2): qty 100

## 2016-12-22 MED ORDER — EPHEDRINE 5 MG/ML INJ
10.0000 mg | INTRAVENOUS | Status: DC | PRN
  Filled 2016-12-22: qty 4

## 2016-12-22 MED ORDER — OXYTOCIN 40 UNITS IN LACTATED RINGERS INFUSION - SIMPLE MED
1.0000 m[IU]/min | INTRAVENOUS | Status: DC
  Administered 2016-12-22: 2 m[IU]/min via INTRAVENOUS
  Administered 2016-12-23: 4 m[IU]/min via INTRAVENOUS
  Filled 2016-12-22: qty 1000

## 2016-12-22 MED ORDER — SOD CITRATE-CITRIC ACID 500-334 MG/5ML PO SOLN
30.0000 mL | ORAL | Status: DC | PRN
Start: 2016-12-22 — End: 2016-12-23

## 2016-12-22 MED ORDER — FLEET ENEMA 7-19 GM/118ML RE ENEM: 1.0000 | ENEMA | RECTAL | Status: DC | PRN

## 2016-12-22 MED ORDER — LACTATED RINGERS IV SOLN
500.0000 mL | INTRAVENOUS | Status: DC | PRN
  Administered 2016-12-22 – 2016-12-23 (×2): 500 mL via INTRAVENOUS

## 2016-12-22 MED ORDER — OXYTOCIN BOLUS FROM INFUSION
500.0000 mL | Freq: Once | INTRAVENOUS | Status: AC
  Administered 2016-12-23: 500 mL via INTRAVENOUS

## 2016-12-22 MED ORDER — FENTANYL CITRATE (PF) 100 MCG/2ML IJ SOLN
100.0000 ug | INTRAMUSCULAR | Status: DC | PRN
Start: 2016-12-22 — End: 2016-12-23

## 2016-12-22 MED ORDER — LACTATED RINGERS IV SOLN
INTRAVENOUS | Status: DC
  Administered 2016-12-22: 125 mL/h via INTRAVENOUS
  Administered 2016-12-22 – 2016-12-23 (×3): via INTRAVENOUS

## 2016-12-22 MED ORDER — TERBUTALINE SULFATE 1 MG/ML IJ SOLN
0.2500 mg | Freq: Once | INTRAMUSCULAR | Status: DC | PRN
  Filled 2016-12-22: qty 1

## 2016-12-22 MED ORDER — OXYCODONE-ACETAMINOPHEN 5-325 MG PO TABS: 1.0000 | ORAL_TABLET | ORAL | Status: DC | PRN

## 2016-12-22 MED ORDER — ACETAMINOPHEN 325 MG PO TABS: 650.0000 mg | ORAL_TABLET | ORAL | Status: DC | PRN

## 2016-12-22 MED ORDER — OXYCODONE-ACETAMINOPHEN 5-325 MG PO TABS: 2.0000 | ORAL_TABLET | ORAL | Status: DC | PRN

## 2016-12-22 MED ORDER — LACTATED RINGERS IV SOLN: 500.0000 mL | Freq: Once | INTRAVENOUS | Status: DC

## 2016-12-22 MED ORDER — DIPHENHYDRAMINE HCL 50 MG/ML IJ SOLN: 12.5000 mg | INTRAMUSCULAR | Status: DC | PRN

## 2016-12-22 MED ORDER — OXYTOCIN 40 UNITS IN LACTATED RINGERS INFUSION - SIMPLE MED: 2.5000 [IU]/h | INTRAVENOUS | Status: DC

## 2016-12-22 MED ORDER — ONDANSETRON HCL 4 MG/2ML IJ SOLN: 4.0000 mg | Freq: Four times a day (QID) | INTRAMUSCULAR | Status: DC | PRN

## 2016-12-22 MED ORDER — PHENYLEPHRINE 40 MCG/ML (10ML) SYRINGE FOR IV PUSH (FOR BLOOD PRESSURE SUPPORT)
80.0000 ug | PREFILLED_SYRINGE | INTRAVENOUS | Status: DC | PRN
  Filled 2016-12-22: qty 5

## 2016-12-22 MED ORDER — LIDOCAINE HCL (PF) 1 % IJ SOLN
30.0000 mL | INTRAMUSCULAR | Status: AC | PRN
  Administered 2016-12-23: 30 mL via SUBCUTANEOUS
  Filled 2016-12-22: qty 30

## 2016-12-22 MED ORDER — PHENYLEPHRINE 40 MCG/ML (10ML) SYRINGE FOR IV PUSH (FOR BLOOD PRESSURE SUPPORT)
80.0000 ug | PREFILLED_SYRINGE | INTRAVENOUS | Status: DC | PRN
  Filled 2016-12-22: qty 10
  Filled 2016-12-22: qty 5

## 2016-12-22 NOTE — Progress Notes (Signed)
S:  Patient feeling cramping but is okay   O:  VS: Blood pressure 114/67, pulse 90, temperature 97.9 F (36.6 C), temperature source Oral, resp. rate 20, height 5\' 4"  (1.626 m), weight 70.8 kg (156 lb),SpO2 99 %.               Cervix : closed/70/-2        Membranes: intact  A: IOL for postdates      FHR category 1  P: Continue pit at 46milli-units/min until able to place foley bulb     Darrol Poke BSN, SNM 12/22/2016, 7:47 AM

## 2016-12-22 NOTE — Anesthesia Pain Management Evaluation Note (Signed)
  [facility] Pain Management Visit Note  Patient: Isabella Hampton, 36 y.o., female  "Hello I am a member of the anesthesia team at [facility]. We have an anesthesia team available at all times to provide care throughout the hospital, including epidural management and anesthesia for C-section. I don't know your plan for the delivery whether it a natural birth, water birth, IV sedation, nitrous supplementation, doula or epidural, but we want to meet your pain goals."   1.Was your pain managed to your expectations on prior hospitalizations?   Yes   2.What is your expectation for pain management during this hospitalization?     Labor support without medications  3.How can we help you reach that goal? Be available if needed  Record the patient's initial score and the patient's pain goal.   Pain: 3  Pain Goal: 10 [facility] wants you to be able to say your pain was always managed very well.  Isabella Hampton 12/22/2016

## 2016-12-22 NOTE — Progress Notes (Signed)
Patient ID: Isabella Hampton, female   DOB: April 01, 1981, 36 y.o.   MRN: [id number]  Feeling crampy with Pit; has been held at 52mu/min during the night  BP 128/75, other VSS FHR 150s, +accels, no decels Ctx q 3 mins Cx 1/50/-2  IUP@term  TOLAC Cx unfavorable  Cervical foley inserted without difficulty Plan Pit for when it comes out- Pit to be stopped now  Serita Grammes CNM 12/22/2016 10:06 AM

## 2016-12-22 NOTE — H&P (Signed)
Isabella Hampton is a 36 y.o. female G2P0001 with IUP at [redacted]w[redacted]d presenting for IOL for postdates- desires VBAC. [facility] at [facility] 16 wks  Prenatal History/Complications:   Past Medical History: Past Medical History:  Diagnosis Date  . Medical history non-contributory     Past Surgical History: Past Surgical History:  Procedure Laterality Date  . CESAREAN SECTION      Obstetrical History: OB History    Gravida Para Term Preterm AB Living   2 0 0 0 0 1   SAB TAB Ectopic Multiple Live Births   0 0 0 0 1      Social History: Social History   Social History  . Marital status: Married    Spouse name: N/A  . Number of children: N/A  . Years of education: N/A   Social History Main Topics  . Smoking status: Never Smoker  . Smokeless tobacco: Never Used  . Alcohol use None  . Drug use: No  . Sexual activity: Yes   Other Topics Concern  . None   Social History Narrative  . None    Family History: History reviewed. No pertinent family history.  Allergies: No Known Allergies  Prescriptions Prior to Admission  Medication Sig Dispense Refill Last Dose  . Prenatal Multivit-Min-Fe-FA (PRENATAL VITAMINS) 0.8 MG tablet Take 1 tablet by mouth daily. 30 tablet 12 Taking  . Prenatal Vit-Fe Fumarate-FA (MULTIVITAMIN-PRENATAL) 27-0.8 MG TABS tablet Take 1 tablet by mouth daily at 12 noon.   Taking    Review of Systems   Constitutional: Negative for fever and chills Eyes: Negative for visual disturbances Respiratory: Negative for shortness of breath, dyspnea Cardiovascular: Negative for chest pain or palpitations  Gastrointestinal: Negative for abdominal pain, vomiting, diarrhea and constipation.   Genitourinary: Negative for dysuria and urgency Musculoskeletal: Negative for back pain, joint pain, myalgias  Neurological: Negative for dizziness and headaches   Blood pressure 119/83, pulse 94, temperature 99.3 F (37.4 C), temperature source Oral, resp. rate 18,  height 5\' 4"  (1.626 m), weight 70.8 kg (156 lb), last menstrual period 03/16/2016, SpO2 99 %. General appearance: alert and cooperative Lungs: clear to auscultation bilaterally Heart: regular rate and rhythm Abdomen: soft, non-tender; bowel sounds normal Extremities: Homans sign is negative, no sign of DVT, no edema  DTR's 2+ Presentation: cephalic Fetal monitoring  Baseline: 150 bpm, Variability: Good {> 6 bpm), Accelerations: Reactive and Decelerations: Variable: mild Uterine activity  Frequency: Every 4-6 minutes and Intensity: mild  Dilation: Fingertip Station: -3 Exam by:: B. parks, RN   Prenatal labs: ABO, Rh: --/--/O POS, O POS (03/02 0124) Antibody: NEG (03/02 0124) Rubella: 7.59 RPR: Non Reactive (12/07 1030)  HBsAg: Negative (09/14 1401)  HIV: Non Reactive (12/07 1030)  GBS: Negative (02/06 1541)    Clinic  [facility] Prenatal Labs  Dating  19 week sono Blood type: O/Positive/-- (09/14 1401)   Genetic Screen declined Antibody:Negative (09/14 1401)  Anatomic [place]  Normal Rubella: 7.59 (09/14 1401)  GTT  Third trimester: WNL RPR: Non Reactive (12/07 1030)   Flu vaccine  declined HBsAg: Negative (09/14 1401)   TDaP vaccine   declined                                        HIV: Non Reactive (12/07 1030)   Baby Food  breast  [id number] (02/06 1541)  Contraception undecided Pap: negative (07/06/16)  Circumcision   n/a   Pediatrician [facility]   Support Person  FOB     Prenatal Transfer Tool  Maternal Diabetes: No Genetic Screening: Declined Maternal Ultrasounds/Referrals: Normal Fetal Ultrasounds or other Referrals:  None Maternal Substance Abuse:  No Significant Maternal Medications:  None Significant Maternal Lab Results: None    Results for orders placed or performed during the hospital encounter of 12/22/16 (from the past 24 hour(s))  CBC   Collection Time: 12/22/16  1:23 AM  Result Value Ref Range   WBC 8.5 4.0 -  10.5 K/uL   RBC 4.26 3.87 - 5.11 MIL/uL   Hemoglobin 11.7 (L) 12.0 - 15.0 g/dL   HCT 33.8 (L) 36.0 - 46.0 %   MCV 79.3 78.0 - 100.0 fL   MCH 27.5 26.0 - 34.0 pg   MCHC 34.6 30.0 - 36.0 g/dL   RDW 13.7 11.5 - 15.5 %   Platelets 182 150 - 400 K/uL  Type and screen [place]   Collection Time: 12/22/16  1:24 AM  Result Value Ref Range   ABO/RH(D) O POS    Antibody Screen NEG    Sample Expiration 12/25/2016   ABO/Rh   Collection Time: 12/22/16  1:24 AM  Result Value Ref Range   ABO/RH(D) O POS     Assessment: Isabella Hampton is a 36 y.o. G2P0001 with an IUP at [redacted]w[redacted]d presenting for IOL for postdates.  Plan: #Labor: pitocin->foley #Pain:  Per request #FWB Cat 1 #ID: GBS: Neg    Darrol Poke BSN, SNM 12/22/2016, 5:21 AM

## 2016-12-22 NOTE — Progress Notes (Signed)
S:  Patient comfortable- feeling a little bit more cramps than earlier   O:  VS: Blood pressure 118/72, pulse 98, temperature 98.4 F (36.9 C), temperature source Oral, resp. rate 18, height 5\' 4"  (1.626 m), weight 70.8 kg (156 lb), last menstrual period 03/16/2016, SpO2 99 %.        FHR : baseline 160 / variability mod / accelerations + / variable decelerations        Toco: mild contractions occasional                 A: Early labor     FHR category 1  P: Foley bulb continued to be in place- traction applied      Restart pitocin once foley bulb out      Darrol Poke BSN, SNM 12/22/2016, 8:46 PM   I have seen and examined this patient and I agree with the above. Serita Grammes 5:44 AM 12/23/2016

## 2016-12-22 NOTE — H&P (Cosign Needed)
error 

## 2016-12-23 ENCOUNTER — Inpatient Hospital Stay (HOSPITAL_COMMUNITY): Payer: BLUE CROSS/BLUE SHIELD | Admitting: Anesthesiology

## 2016-12-23 ENCOUNTER — Encounter (HOSPITAL_COMMUNITY): Payer: Self-pay

## 2016-12-23 DIAGNOSIS — Z3A41 41 weeks gestation of pregnancy: Secondary | ICD-10-CM

## 2016-12-23 DIAGNOSIS — O34219 Maternal care for unspecified type scar from previous cesarean delivery: Secondary | ICD-10-CM

## 2016-12-23 DIAGNOSIS — O480 Post-term pregnancy: Secondary | ICD-10-CM

## 2016-12-23 MED ORDER — OXYCODONE-ACETAMINOPHEN 5-325 MG PO TABS: 2.0000 | ORAL_TABLET | ORAL | Status: DC | PRN

## 2016-12-23 MED ORDER — FERROUS SULFATE 325 (65 FE) MG PO TABS
325.0000 mg | ORAL_TABLET | Freq: Two times a day (BID) | ORAL | Status: DC
  Administered 2016-12-24 – 2016-12-25 (×3): 325 mg via ORAL
  Filled 2016-12-23 (×3): qty 1

## 2016-12-23 MED ORDER — BENZOCAINE-MENTHOL 20-0.5 % EX AERO
1.0000 "application " | INHALATION_SPRAY | CUTANEOUS | Status: DC | PRN
  Administered 2016-12-23: 1 via TOPICAL
  Filled 2016-12-23: qty 56

## 2016-12-23 MED ORDER — PRENATAL MULTIVITAMIN CH
1.0000 | ORAL_TABLET | Freq: Every day | ORAL | Status: DC
  Administered 2016-12-24: 1 via ORAL
  Filled 2016-12-23 (×2): qty 1

## 2016-12-23 MED ORDER — TETANUS-DIPHTH-ACELL PERTUSSIS 5-2.5-18.5 LF-MCG/0.5 IM SUSP: 0.5000 mL | Freq: Once | INTRAMUSCULAR | Status: DC

## 2016-12-23 MED ORDER — ACETAMINOPHEN 500 MG PO TABS
1000.0000 mg | ORAL_TABLET | Freq: Once | ORAL | Status: AC
  Administered 2016-12-23: 1000 mg via ORAL
  Filled 2016-12-23: qty 2

## 2016-12-23 MED ORDER — LIDOCAINE HCL (PF) 1 % IJ SOLN
INTRAMUSCULAR | Status: DC | PRN
  Administered 2016-12-23: 6 mL via EPIDURAL
  Administered 2016-12-23: 4 mL

## 2016-12-23 MED ORDER — ZOLPIDEM TARTRATE 5 MG PO TABS: 5.0000 mg | ORAL_TABLET | Freq: Every evening | ORAL | Status: DC | PRN

## 2016-12-23 MED ORDER — MAGNESIUM HYDROXIDE 400 MG/5ML PO SUSP: 30.0000 mL | ORAL | Status: DC | PRN

## 2016-12-23 MED ORDER — DIBUCAINE 1 % RE OINT: 1.0000 "application " | TOPICAL_OINTMENT | RECTAL | Status: DC | PRN

## 2016-12-23 MED ORDER — SIMETHICONE 80 MG PO CHEW: 80.0000 mg | CHEWABLE_TABLET | ORAL | Status: DC | PRN

## 2016-12-23 MED ORDER — IBUPROFEN 600 MG PO TABS
600.0000 mg | ORAL_TABLET | Freq: Four times a day (QID) | ORAL | Status: DC
  Administered 2016-12-23 – 2016-12-25 (×7): 600 mg via ORAL
  Filled 2016-12-23 (×8): qty 1

## 2016-12-23 MED ORDER — ONDANSETRON HCL 4 MG/2ML IJ SOLN: 4.0000 mg | INTRAMUSCULAR | Status: DC | PRN

## 2016-12-23 MED ORDER — DIPHENHYDRAMINE HCL 25 MG PO CAPS: 25.0000 mg | ORAL_CAPSULE | Freq: Four times a day (QID) | ORAL | Status: DC | PRN

## 2016-12-23 MED ORDER — COCONUT OIL OIL: 1.0000 "application " | TOPICAL_OIL | Status: DC | PRN

## 2016-12-23 MED ORDER — ONDANSETRON HCL 4 MG PO TABS: 4.0000 mg | ORAL_TABLET | ORAL | Status: DC | PRN

## 2016-12-23 MED ORDER — WITCH HAZEL-GLYCERIN EX PADS: 1.0000 "application " | MEDICATED_PAD | CUTANEOUS | Status: DC | PRN

## 2016-12-23 MED ORDER — ACETAMINOPHEN 325 MG PO TABS
650.0000 mg | ORAL_TABLET | ORAL | Status: DC | PRN
  Administered 2016-12-25: 650 mg via ORAL
  Filled 2016-12-23: qty 2

## 2016-12-23 MED ORDER — OXYCODONE-ACETAMINOPHEN 5-325 MG PO TABS: 1.0000 | ORAL_TABLET | ORAL | Status: DC | PRN

## 2016-12-23 MED ORDER — MEASLES, MUMPS & RUBELLA VAC ~~LOC~~ INJ
0.5000 mL | INJECTION | Freq: Once | SUBCUTANEOUS | Status: DC
  Filled 2016-12-23: qty 0.5

## 2016-12-23 NOTE — Anesthesia Preprocedure Evaluation (Signed)

## 2016-12-23 NOTE — Progress Notes (Signed)
Patient ID: Isabella Hampton, female   DOB: 03/21/1981, 36 y.o.   MRN: [id number] Isabella Hampton is a 36 y.o. G2P0001 at [redacted]w[redacted]d.  Subjective: Comfortable w/ epidural  Objective: BP (!) 143/76   Pulse (!) 108   Temp 100.3 F (37.9 C) (Oral)   Resp 16   Ht 5\' 4"  (1.626 m)   Wt 156 lb (70.8 kg)   LMP 03/16/2016 (Approximate)   SpO2 100%   BMI 26.78 kg/m    FHT:  FHR: 160 bpm, variability: mod,  accelerations:  15x15,  decelerations:  Intermittent variables, few lates, resolved.  UC:   Q 2-5 minutes, strong Dilation: 8 Effacement (%): 90 Cervical Position: Middle Station: 0 Presentation: Vertex Exam by:: [employer]: No results found for this or any previous visit (from the past 24 hour(s)).  Assessment / Plan: [redacted]w[redacted]d week IUP Labor: transition, TOLAC progressing well Fetal Wellbeing:  Category I-II, but overall reassuring fetal status Pain Control:  Epidural Anticipated MOD:  SVD  [place], CNM 12/23/2016 11:44 AM

## 2016-12-23 NOTE — Anesthesia Procedure Notes (Signed)
Epidural Patient location during procedure: OB  Staffing Anesthesiologist: Delorse Shane  Preanesthetic Checklist Completed: patient identified, site marked, surgical consent, pre-op evaluation, timeout performed, IV checked, risks and benefits discussed and monitors and equipment checked  Epidural Patient position: sitting Prep: site prepped and draped and DuraPrep Patient monitoring: continuous pulse ox and blood pressure Approach: midline Location: L3-L4 Injection technique: LOR air  Needle:  Needle type: Tuohy  Needle gauge: 17 G Needle length: 9 cm and 9 Needle insertion depth: 5 cm cm Catheter type: closed end flexible Catheter size: 19 Gauge Catheter at skin depth: 10 cm Test dose: negative  Assessment Events: blood not aspirated, injection not painful, no injection resistance, negative IV test and no paresthesia  Additional Notes Dosing of Epidural:  1st dose, through catheter .............................................  Xylocaine 40 mg  2nd dose, through catheter, after waiting 3 minutes.........Xylocaine 60 mg    As each dose occurred, patient was free of IV sx; and patient exhibited no evidence of SA injection.  Patient is more comfortable after epidural dosed. Please see RN's note for documentation of vital signs,and FHR which are stable.  Patient reminded not to try to ambulate with numb legs, and that an RN must be present when she attempts to get up.        

## 2016-12-23 NOTE — Progress Notes (Signed)
S:  Patient uncomfortable with contractions- requesting epidural   O:  VS: Blood pressure 117/68, pulse 79, temperature 97.9 F (36.6 C), resp. rate 18, height 5\' 4"  (1.626 m), weight 70.8 kg (156 lb), last menstrual period 03/16/2016, SpO2 99 %.        FHR : baseline 150 / variability mod / accelerations present / early decelerations        Toco: contractions every 3-5 minutes / mild         Dilation: 3.5 Effacement (%): 80 Cervical Position: Middle Station: -2 Presentation: Vertex Exam by:: Darrol Poke CNM          Membranes: intact  A: Early IOL- slow progress      FHR category 1  P: Foley bulb out at 0137      Continue pitocin titration      Recheck after comfortable with epidural      Darrol Poke BSN, SNM 12/23/2016, 2:01 AM   I have seen and examined this patient and I agree with the above. Serita Grammes 5:44 AM 12/23/2016

## 2016-12-24 NOTE — Anesthesia Postprocedure Evaluation (Signed)
Anesthesia Post Note  Patient: Isabella Hampton  Procedure(s) Performed: * No procedures listed *  Patient location during evaluation: Mother Baby Anesthesia Type: Epidural Level of consciousness: awake and alert Pain management: pain level controlled Vital Signs Assessment: post-procedure vital signs reviewed and stable Respiratory status: spontaneous breathing, nonlabored ventilation and respiratory function stable Cardiovascular status: stable Postop Assessment: no headache, no backache and epidural receding Anesthetic complications: no        Last Vitals:  Vitals:   12/23/16 2221 12/24/16 0551  BP: (!) 111/59 (!) 93/56  Pulse: 72 77  Resp: 18 16  Temp: 37 C 36.7 C    Last Pain:  Vitals:   12/24/16 0834  TempSrc:   PainSc: 0-No pain   Pain Goal: Patients Stated Pain Goal: 4 (12/22/16 2330)               Gilmer Mor

## 2016-12-24 NOTE — Lactation Note (Signed)
This note was copied from a baby's chart. Lactation Consultation Note: Mother is experienced with breastfeeding another child for 4 months. Mother reports that infant is breastfeeding well. Review of basic breastfeeding. Lactation brochure given with information on breastfeeding resources.   Patient Name: Isabella Hampton [id number] Date: 12/24/2016 Reason for consult: Initial assessment   Maternal Data    Feeding    LATCH Score/Interventions                      Lactation Tools Discussed/Used     Consult Status Consult Status: Follow-up Date: 12/24/16 Follow-up type: In-patient    Jess Barters [facility] 12/24/2016, 2:32 PM

## 2016-12-24 NOTE — Progress Notes (Signed)
POSTPARTUM PROGRESS NOTE  Post Partum Day 1 Subjective:  Isabella Hampton is a 36 y.o. G2P1002 [redacted]w[redacted]d s/p VBAC.  No acute events overnight.  Pt denies problems with ambulating, voiding or po intake.  She denies nausea or vomiting.  Pain is well controlled.  She has had flatus. She has not had bowel movement.  Lochia Small.   Objective: Blood pressure (!) 93/56, pulse 77, temperature 98 F (36.7 C), temperature source Oral, resp. rate 16, height 5\' 4"  (1.626 m), weight 156 lb (70.8 kg), last menstrual period 03/16/2016, SpO2 100 %, unknown if currently breastfeeding.  Physical Exam:  General: alert, cooperative and no distress Lochia:normal flow Chest: no respiratory distress Heart:regular rate, distal pulses intact Abdomen: soft, nontender,  Uterine Fundus: firm, appropriately tender DVT Evaluation: No calf swelling or tenderness Extremities: trace edema   Recent Labs  12/22/16 0123  HGB 11.7*  HCT 33.8*    Assessment/Plan:  ASSESSMENT: Isabella Hampton is a 36 y.o. G2P1002 [redacted]w[redacted]d s/p SVD  Plan for discharge tomorrow and Breastfeeding  Patient remains unsure about birth control   LOS: 2 days   Brayton Mars 12/24/2016, 7:54 AM

## 2016-12-25 MED ORDER — OXYCODONE-ACETAMINOPHEN 5-325 MG PO TABS: 1.0000 | ORAL_TABLET | ORAL | 0 refills | Status: AC | PRN

## 2016-12-25 MED ORDER — IBUPROFEN 600 MG PO TABS: 600.0000 mg | ORAL_TABLET | Freq: Four times a day (QID) | ORAL | 2 refills | Status: AC

## 2016-12-25 MED ORDER — FERROUS SULFATE 325 (65 FE) MG PO TABS: 325.0000 mg | ORAL_TABLET | Freq: Two times a day (BID) | ORAL | 3 refills | Status: AC

## 2016-12-25 NOTE — Lactation Note (Signed)
This note was copied from a baby's chart. Lactation Consultation Note  Patient Name: Isabella Hampton [id number] Date: 12/25/2016   Visited with Mom on day of discharge.  Baby 15 hrs old, at at 2% weight loss.  + DAT.  Mom choosing to offer formula supplement after breastfeeding.  Mom states baby latches well, but due to bilirubin levels.  Mom wanted baby to get extra fluids.  Mom has a manual breast pump.  Talked to Mom about obtaining a DEBP from her insurance.  Mom to call.   Recommended STS and cue based feedings.  Engorgement prevention and treatment discussed. OP lactation services discussed.  Mom encouraged to call prn.   Broadus John 12/25/2016, 11:38 AM

## 2016-12-25 NOTE — Discharge Summary (Signed)
OB Discharge Summary     Patient Name: Isabella Hampton DOB: 07/31/1981 MRN: [id number]  Date of admission: 12/22/2016 Delivering MD: Manya Silvas   Date of discharge: 12/25/2016  Admitting diagnosis: INDUCTION Intrauterine pregnancy: [redacted]w[redacted]d     Secondary diagnosis:  Active Problems:   VBAC, delivered  Additional problems: Ch[place]     Discharge diagnosis: Term Pregnancy Delivered and VBAC                                                                                                Post partum procedures:none  Augmentation: AROM, Pitocin and Foley Balloon  Complications: None  Hospital course:  Induction of Labor With Vaginal Delivery   36 y.o. yo G2P1002 at [redacted]w[redacted]d was admitted to the hospital 12/22/2016 for induction of labor.  Indication for induction: Postdates and VBAC.  Patient had an uncomplicated labor course as follows: Membrane Rupture Time/Date: 3:30 AM ,12/23/2016   Intrapartum Procedures: Episiotomy: None [1]                                         Lacerations:  2nd degree [3];Perineal [11]  Patient had delivery of a Viable infant.  Information for the patient's newborn:  Shureka, Kovanda [id number]  Delivery Method: VBAC, Spontaneous (Filed from Delivery Summary)   12/23/2016  Details of delivery can be found in separate delivery note.  Patient had a routine postpartum course. Patient is discharged home 12/25/16.  Physical exam  Vitals:   12/24/16 0551 12/24/16 1900 12/24/16 2020 12/25/16 0613  BP: (!) 93/56 113/74  100/62  Pulse: 77 (!) 110 97 89  Resp: 16 20  18   Temp: 98 F (36.7 C) 99.3 F (37.4 C)  98 F (36.7 C)  TempSrc: Oral Oral  Oral  SpO2:      Weight:      Height:       General: alert, cooperative and no distress Lochia: appropriate Uterine Fundus: firm Incision: Anesthesia: Epidural, Local Episiotomy: None Lacerations: 2nd degree;Perineal Suture Repair: 3.0 vicryl rapide Est. Blood Loss (mL): 200 DVT Evaluation: No evidence of DVT  seen on physical exam. No cords or calf tenderness. No significant calf/ankle edema. Labs: Lab Results  Component Value Date   WBC 8.5 12/22/2016   HGB 11.7 (L) 12/22/2016   HCT 33.8 (L) 12/22/2016   MCV 79.3 12/22/2016   PLT 182 12/22/2016   No flowsheet data found.  Discharge instruction: per After Visit Summary and "Baby and Me Booklet".  After visit meds:  Allergies as of 12/25/2016   No Known Allergies     Medication List    TAKE these medications   ferrous sulfate 325 (65 FE) MG tablet Take 1 tablet (325 mg total) by mouth 2 (two) times daily with a meal.   ibuprofen 600 MG tablet Commonly known as:  ADVIL,MOTRIN Take 1 tablet (600 mg total) by mouth every 6 (six) hours.   OVER THE COUNTER MEDICATION Take 1 tablet by mouth daily as needed (heartburn). OTC heartburn  medication, pt not sure of the name   oxyCODONE-acetaminophen 5-325 MG tablet Commonly known as:  PERCOCET/ROXICET Take 1-2 tablets by mouth every 4 (four) hours as needed (pain scale > 7).   Prenatal Vitamins 0.8 MG tablet Take 1 tablet by mouth daily.       Diet: routine diet  Activity: Advance as tolerated. Pelvic rest for 6 weeks.   Outpatient follow up:4 weeks Follow up Appt:Future Appointments Date Time Provider [place]  01/18/2017 8:45 AM Morene Crocker, CNM [facility] None   Follow up Visit:No Follow-up on file.  Postpartum contraception: Undecided  Newborn Data: Live born female  Birth Weight: 6 lb 10.4 oz (3015 g) APGAR: 8, 9  Baby Feeding: Bottle and Breast Disposition:home with mother   12/25/2016 Morene Crocker, CNM

## 2016-12-25 NOTE — Progress Notes (Signed)
Post Partum Day #2 Subjective: no complaints, up ad lib, voiding and tolerating PO  Objective: Blood pressure 100/62, pulse 89, temperature 98 F (36.7 C), temperature source Oral, resp. rate 18, height 5\' 4"  (1.626 m), weight 156 lb (70.8 kg), last menstrual period 03/16/2016, SpO2 100 %, unknown if currently breastfeeding.  Physical Exam:  General: alert, cooperative and no distress Lochia: appropriate Uterine Fundus: firm Incision: 2nd degree healing well DVT Evaluation: No evidence of DVT seen on physical exam. No cords or calf tenderness. No significant calf/ankle edema.  No results for input(s): HGB, HCT in the last 72 hours.  Assessment/Plan: Discharge home, Breastfeeding and Contraception undecided   LOS: 3 days   Morene Crocker, CNM 12/25/2016, 7:38 AM

## 2017-01-18 ENCOUNTER — Ambulatory Visit (INDEPENDENT_AMBULATORY_CARE_PROVIDER_SITE_OTHER): Payer: BLUE CROSS/BLUE SHIELD | Admitting: Certified Nurse Midwife

## 2017-01-18 ENCOUNTER — Encounter: Payer: Self-pay | Admitting: Certified Nurse Midwife

## 2017-01-18 DIAGNOSIS — J020 Streptococcal pharyngitis: Secondary | ICD-10-CM

## 2017-01-18 DIAGNOSIS — O34219 Maternal care for unspecified type scar from previous cesarean delivery: Secondary | ICD-10-CM

## 2017-01-18 LAB — POCT RAPID STREP A (OFFICE): RAPID STREP A SCREEN: POSITIVE — AB

## 2017-01-18 MED ORDER — FLUCONAZOLE 200 MG PO TABS: 200.0000 mg | ORAL_TABLET | Freq: Once | ORAL | 0 refills | Status: AC

## 2017-01-18 MED ORDER — AMOXICILLIN-POT CLAVULANATE 875-125 MG PO TABS: 1.0000 | ORAL_TABLET | Freq: Two times a day (BID) | ORAL | 0 refills | Status: AC

## 2017-01-18 NOTE — Progress Notes (Signed)
..  Post Partum Exam  Isabella Hampton is a 36 y.o. G11P1002 female who presents for a postpartum visit. She is 4 weeks postpartum following a spontaneous vaginal delivery. I have fully reviewed the prenatal and intrapartum course. The delivery was at 41.1 gestational weeks.  Anesthesia: epidural. Postpartum course has been normal. Baby's course has been normal. Baby is feeding by breast. Bleeding no bleeding. Bowel function is normal. Bladder function is normal. Patient is not sexually active. Contraception method is IUD. Postpartum depression screening:neg.  Reports sore throat.  Reports that   The following portions of the patient's history were reviewed and updated as appropriate: allergies, current medications, past family history, past medical history, past social history, past surgical history and problem list.  Review of Systems Pertinent items noted in HPI and remainder of comprehensive ROS otherwise negative.    Objective:  unknown if currently breastfeeding.  General:  alert, cooperative and no distress   Breasts:  inspection negative, no nipple discharge or bleeding, no masses or nodularity palpable  Lungs: clear to auscultation bilaterally  Heart:  regular rate and rhythm, S1, S2 normal, no murmur, click, rub or gallop  Abdomen: soft, non-tender; bowel sounds normal; no masses,  no organomegaly  Pelvic Exam: Not performed.        Assessment:    Normal 4 week postpartum exam. Pap smear not done at today's visit.   Strep pharyngitis: Augmentin  Plan:   1. Contraception: abstinence 2. IUD planned. 3. Strep 4. Follow up in: 2 weeks for IUD insertion Verdia Kuba) or as needed.

## 2017-02-01 ENCOUNTER — Ambulatory Visit: Payer: BLUE CROSS/BLUE SHIELD | Admitting: Certified Nurse Midwife

## 2017-03-12 ENCOUNTER — Ambulatory Visit (INDEPENDENT_AMBULATORY_CARE_PROVIDER_SITE_OTHER): Payer: BLUE CROSS/BLUE SHIELD | Admitting: Certified Nurse Midwife

## 2017-03-12 ENCOUNTER — Encounter: Payer: Self-pay | Admitting: Certified Nurse Midwife

## 2017-03-12 ENCOUNTER — Encounter: Payer: Self-pay | Admitting: *Deleted

## 2017-03-12 VITALS — BP 140/90 | HR 92 | Wt 151.0 lb

## 2017-03-12 DIAGNOSIS — Z3202 Encounter for pregnancy test, result negative: Secondary | ICD-10-CM | POA: Diagnosis not present

## 2017-03-12 DIAGNOSIS — Z30430 Encounter for insertion of intrauterine contraceptive device: Secondary | ICD-10-CM | POA: Diagnosis not present

## 2017-03-12 DIAGNOSIS — Z01812 Encounter for preprocedural laboratory examination: Secondary | ICD-10-CM

## 2017-03-12 LAB — POCT URINE PREGNANCY: Preg Test, Ur: NEGATIVE

## 2017-03-12 MED ORDER — LEVONORGESTREL 19.5 MG IU IUD
1.0000 | INTRAUTERINE_SYSTEM | Freq: Once | INTRAUTERINE | Status: AC
  Administered 2017-03-12: 1 via INTRAUTERINE

## 2017-03-12 NOTE — Progress Notes (Signed)
IUD Procedure Note   DIAGNOSIS: Desires long-term, reversible contraception   PROCEDURE: IUD placement Performing Provider: Kandis Cocking CNM  Patient counseled prior to procedure. I explained risks and benefits of Kyleena IUD, reviewed alternative forms of contraception. Patient stated understanding and consented to continue with procedure.   LMP: unknown, breastfeeding, postpartum Pregnancy Test: Negative Lot #: [id number] Expiration Date: 03/2018   IUD type: [    ] Mirena   [    ] Paragard  [    ] Lyletta   [X]   Kyleena  PROCEDURE:  Timeout procedure was performed to ensure right patient and right site.  A bimanual exam was performed to determine the position of the uterus, retroverted. The speculum was placed. The vagina and cervix was sterilized in the usual manner and sterile technique was maintained throughout the course of the procedure. A single toothed tenaculum was applied to the posterior lip of the cervix and gentle traction applied. The depth of the uterus was sounded to 13 cm. With gentle traction on the tenaculum, the IUD was inserted to the appropriate depth and inserted without difficulty.  The string was cut to an estimated 4 cm length. Bleeding was minimal. The patient tolerated the procedure well.   Follow up: The patient tolerated the procedure well without complications.  Standard post-procedure care is explained and return precautions are given.  Kandis Cocking CNM

## 2017-03-12 NOTE — Addendum Note (Signed)
Addended by: Lewie Loron D on: 03/12/2017 05:05 PM   Modules accepted: Orders
